# Patient Record
Sex: Female | Born: 1965 | Race: White | Hispanic: No | Marital: Single | Smoking: Current every day smoker
Health system: Southern US, Community
[De-identification: ages and names within clinical notes are randomized; demographics above are authoritative.]

## PROBLEM LIST (undated history)

## (undated) DIAGNOSIS — Z789 Other specified health status: Secondary | ICD-10-CM

## (undated) HISTORY — PX: OTHER SURGICAL HISTORY: SHX169

---

## 2017-02-05 ENCOUNTER — Encounter (HOSPITAL_COMMUNITY): Payer: Self-pay | Admitting: Pulmonary Disease

## 2017-02-05 ENCOUNTER — Inpatient Hospital Stay (HOSPITAL_COMMUNITY)
Admission: AD | Admit: 2017-02-05 | Discharge: 2017-03-05 | DRG: 871 | Disposition: E | Payer: Self-pay | Source: Other Acute Inpatient Hospital | Attending: Pulmonary Disease | Admitting: Pulmonary Disease

## 2017-02-05 ENCOUNTER — Inpatient Hospital Stay (HOSPITAL_COMMUNITY): Payer: Self-pay

## 2017-02-05 DIAGNOSIS — I959 Hypotension, unspecified: Secondary | ICD-10-CM | POA: Diagnosis present

## 2017-02-05 DIAGNOSIS — G9340 Encephalopathy, unspecified: Secondary | ICD-10-CM | POA: Diagnosis present

## 2017-02-05 DIAGNOSIS — J9601 Acute respiratory failure with hypoxia: Secondary | ICD-10-CM | POA: Diagnosis present

## 2017-02-05 DIAGNOSIS — J869 Pyothorax without fistula: Secondary | ICD-10-CM

## 2017-02-05 DIAGNOSIS — Z9689 Presence of other specified functional implants: Secondary | ICD-10-CM

## 2017-02-05 DIAGNOSIS — I468 Cardiac arrest due to other underlying condition: Secondary | ICD-10-CM | POA: Diagnosis not present

## 2017-02-05 DIAGNOSIS — E669 Obesity, unspecified: Secondary | ICD-10-CM | POA: Diagnosis present

## 2017-02-05 DIAGNOSIS — Z4682 Encounter for fitting and adjustment of non-vascular catheter: Secondary | ICD-10-CM

## 2017-02-05 DIAGNOSIS — A419 Sepsis, unspecified organism: Principal | ICD-10-CM | POA: Diagnosis present

## 2017-02-05 DIAGNOSIS — R739 Hyperglycemia, unspecified: Secondary | ICD-10-CM | POA: Diagnosis present

## 2017-02-05 DIAGNOSIS — J948 Other specified pleural conditions: Secondary | ICD-10-CM | POA: Diagnosis present

## 2017-02-05 DIAGNOSIS — K760 Fatty (change of) liver, not elsewhere classified: Secondary | ICD-10-CM | POA: Diagnosis present

## 2017-02-05 DIAGNOSIS — E872 Acidosis: Secondary | ICD-10-CM | POA: Diagnosis present

## 2017-02-05 DIAGNOSIS — F17210 Nicotine dependence, cigarettes, uncomplicated: Secondary | ICD-10-CM | POA: Diagnosis present

## 2017-02-05 DIAGNOSIS — R6521 Severe sepsis with septic shock: Secondary | ICD-10-CM | POA: Diagnosis present

## 2017-02-05 DIAGNOSIS — J189 Pneumonia, unspecified organism: Secondary | ICD-10-CM | POA: Diagnosis present

## 2017-02-05 HISTORY — DX: Other specified health status: Z78.9

## 2017-02-05 MED ORDER — LIDOCAINE HCL (PF) 1 % IJ SOLN
INTRAMUSCULAR | Status: AC
  Filled 2017-02-05: qty 5

## 2017-02-05 MED ORDER — ONDANSETRON HCL 4 MG/2ML IJ SOLN
4.0000 mg | Freq: Four times a day (QID) | INTRAMUSCULAR | Status: DC | PRN
Start: 2017-02-05 — End: 2017-02-05

## 2017-02-05 MED ORDER — FENTANYL BOLUS VIA INFUSION
25.0000 ug | INTRAVENOUS | Status: DC | PRN
  Filled 2017-02-05: qty 50

## 2017-02-05 MED ORDER — HEPARIN SODIUM (PORCINE) 5000 UNIT/ML IJ SOLN
5000.0000 [IU] | Freq: Three times a day (TID) | INTRAMUSCULAR | Status: DC
  Filled 2017-02-05: qty 1

## 2017-02-05 MED ORDER — NOREPINEPHRINE BITARTRATE 1 MG/ML IV SOLN
0.0000 ug/min | INTRAVENOUS | Status: DC
  Administered 2017-02-05: 20 ug/min via INTRAVENOUS
  Filled 2017-02-05 (×2): qty 4

## 2017-02-05 MED ORDER — LIDOCAINE HCL (PF) 1 % IJ SOLN
INTRAMUSCULAR | Status: AC
  Filled 2017-02-05: qty 30

## 2017-02-05 MED ORDER — INSULIN REGULAR BOLUS VIA INFUSION
0.0000 [IU] | Freq: Three times a day (TID) | INTRAVENOUS | Status: DC
  Filled 2017-02-05: qty 10

## 2017-02-05 MED ORDER — SODIUM BICARBONATE 8.4 % IV SOLN
INTRAVENOUS | Status: AC
  Filled 2017-02-05: qty 50

## 2017-02-05 MED ORDER — FENTANYL 2500MCG IN NS 250ML (10MCG/ML) PREMIX INFUSION
25.0000 ug/h | INTRAVENOUS | Status: DC
  Administered 2017-02-05: 150 ug/h via INTRAVENOUS

## 2017-02-05 MED ORDER — SODIUM CHLORIDE 0.9 % IV SOLN: INTRAVENOUS | Status: DC

## 2017-02-05 MED ORDER — SODIUM CHLORIDE 0.9% FLUSH: 10.0000 mL | Freq: Two times a day (BID) | INTRAVENOUS | Status: DC

## 2017-02-05 MED ORDER — VANCOMYCIN HCL IN DEXTROSE 1-5 GM/200ML-% IV SOLN
1000.0000 mg | Freq: Two times a day (BID) | INTRAVENOUS | Status: DC
  Filled 2017-02-05 (×2): qty 200

## 2017-02-05 MED ORDER — SODIUM CHLORIDE 0.9 % IV SOLN: 250.0000 mL | INTRAVENOUS | Status: DC | PRN

## 2017-02-05 MED ORDER — DEXTROSE 50 % IV SOLN: 25.0000 mL | INTRAVENOUS | Status: DC | PRN

## 2017-02-05 MED ORDER — CHLORHEXIDINE GLUCONATE CLOTH 2 % EX PADS: 6.0000 | MEDICATED_PAD | Freq: Every day | CUTANEOUS | Status: DC

## 2017-02-05 MED ORDER — HYDROCORTISONE NA SUCCINATE PF 100 MG IJ SOLR
50.0000 mg | Freq: Three times a day (TID) | INTRAMUSCULAR | Status: DC
  Filled 2017-02-05: qty 1

## 2017-02-05 MED ORDER — IPRATROPIUM-ALBUTEROL 0.5-2.5 (3) MG/3ML IN SOLN: 3.0000 mL | Freq: Four times a day (QID) | RESPIRATORY_TRACT | Status: DC

## 2017-02-05 MED ORDER — LEVOFLOXACIN IN D5W 750 MG/150ML IV SOLN: 750.0000 mg | INTRAVENOUS | Status: DC

## 2017-02-05 MED ORDER — VASOPRESSIN 20 UNIT/ML IV SOLN
0.0300 [IU]/min | INTRAVENOUS | Status: DC
  Filled 2017-02-05: qty 2

## 2017-02-05 MED ORDER — NOREPINEPHRINE BITARTRATE 1 MG/ML IV SOLN
0.0000 ug/min | INTRAVENOUS | Status: DC
  Administered 2017-02-05: 50 ug/min via INTRAVENOUS
  Filled 2017-02-05: qty 16

## 2017-02-05 MED ORDER — SODIUM CHLORIDE 0.9 % IV SOLN
1.0000 g | Freq: Three times a day (TID) | INTRAVENOUS | Status: DC
  Filled 2017-02-05 (×3): qty 1

## 2017-02-05 MED ORDER — PANTOPRAZOLE SODIUM 40 MG IV SOLR
40.0000 mg | Freq: Every day | INTRAVENOUS | Status: DC
Start: 2017-02-05 — End: 2017-02-05
  Filled 2017-02-05: qty 40

## 2017-02-05 MED ORDER — SODIUM CHLORIDE 0.9 % IV SOLN
INTRAVENOUS | Status: DC
  Filled 2017-02-05: qty 1

## 2017-02-05 MED ORDER — STERILE WATER FOR INJECTION IV SOLN
INTRAVENOUS | Status: DC
  Administered 2017-02-05: 11:00:00 via INTRAVENOUS
  Filled 2017-02-05 (×4): qty 850

## 2017-02-05 MED ORDER — MIDAZOLAM HCL 2 MG/2ML IJ SOLN: 1.0000 mg | INTRAMUSCULAR | Status: DC | PRN

## 2017-02-05 MED ORDER — SODIUM CHLORIDE 0.9% FLUSH: 10.0000 mL | INTRAVENOUS | Status: DC | PRN

## 2017-02-07 ENCOUNTER — Telehealth: Payer: Self-pay

## 2017-02-07 MED FILL — Medication: Qty: 1 | Status: AC

## 2017-02-07 NOTE — Telephone Encounter (Signed)
On 02/07/17 I received a death certificate from [facility] (faxed). The death certificate is for cremation. The patient is a patient of Designer, television/film setDoctor Nestor. The death certificate will be taken to Pulmonary Care this pm for signature.  On 02/10/17 I received the death certificate back from Doctor CokesburyNestor. I got the death certificate ready and called the funeral home to let them know I faxed the death certificate to the funeral home per the funeral home request.

## 2017-02-12 ENCOUNTER — Telehealth: Payer: Self-pay

## 2017-02-12 NOTE — Telephone Encounter (Signed)
On 02/12/2017 I received a [facility] The death certificate is for cremation. The patient is a patient of Designer, television/film setDoctor Nestor. The death certificate will be taken to Pulmonary Unit this pm for signature. On 02/13/2017 I received the death certificate back from Doctor PerryNestor. I got the death certificate ready and called the funeral home to let them know I mailed the death certificate to [employer] per the funeral home request.

## 2017-03-05 NOTE — Discharge Summary (Addendum)
DEATH NOTE: For a complete accounting of the patient's history and physical exam on presentation please refer to the H&P dictated by myself on 02/08/2017. The patient had no known history of any medical problems but also did not routinely seek medical care. She was a known chronic user of tobacco/cigarettes. As per the patient's significant other she has reportedly been treated for pneumonia & bronchitis multiple times since March. The patient has had continued cough that has been productive. The patient had a questionable mold exposure in her home and that time period as well. She had been treated with cough suppressant medications and prednisone as well as antibiotics including Levaquin without resolution of her symptoms. The patient ultimately presented to outside hospital with increased dyspnea and cough on 03/03/2017. In the emergency department the patient's respiratory status became more unstable and her level of alertness declined ultimately prompting endotracheal intubation by the emergency department physician. We were contacted to accept the patient in transfer to provide critical care treatment that was not available at their facility. Prior to transport the patient's clinical status continued to worsen and she became progressively hypotensive and developed a worsening metabolic acidosis. A femoral central venous catheter and arterial line were placed by the emergency department physician and she was subsequently started on a vasopressor infusion. Prior to transport I requested that a bicarbonate drip be initiated for patient's worsening acidosis. Unfortunately, this could not be achieved in a rapid fashion and the patient was transported to our facility without this being initiated. I gave instruction to give a bolus of 1 ampule of sodium bicarbonate en route to our facility to the transporting staff. Upon arrival patient was started on a bicarbonate drip and also given bolus bicarbonate. She was continued on  vasopressor infusion and orders were placed for broad-spectrum antibiotic therapy. Given the patient's right pleural effusion seen on CT imaging just prior to her transport I contacted thoracic surgery to perform a consultation and surgical chest tube placement. Patient had right basilar hydropneumothorax identified on stat portable chest x-ray after arrival to the ICU. Radiology also did comment on a possible left pneumothorax as well. Dr. Tyrone SageGerhardt came rapidly to the patient's bedside and placed a chest tube which resulted in expulsion of purulent fluid under pressure. Shortly thereafter the patient's clinical status continued to deteriorate and she went into a pulseless electrical activity arrest. CODE BLUE was initiated along with CPR and resuscitation efforts. Patient's mental drip was stopped and vasopressor drip was continued along with her bicarbonate drip. Despite bolus bicarbonate, epinephrine, and good quality CPR the patient never regained spontaneous circulation and her rhythm deteriorated to asystole on telemetry monitoring. The patient's mother and sister were outside of the room during our resuscitation efforts. At 11:50 AM on 02/17/2017 I pronounced the patient dead with asystolic rhythm on telemetry monitoring and no palpable or dopplerable pulse without arterial waveform. Hospital chaplain was at bedside and providing support for the patient's family. Patient's nurse contacted the medical examiner who felt this was not a candidate case.  DIAGNOSES AT DEATH: 1. Severe sepsis with septic shock 2. Acute hypoxic respiratory failure 3. Metabolic/lactic acidosis 4. Acute encephalopathy 5. Right empyema 6. Bilateral pneumothoraces 7. Hyperglycemia 8. Healthcare associated pneumonia  9. Tobacco use disorder  10. Obesity

## 2017-03-05 NOTE — H&P (Addendum)
[facility] Attending Consult  Physician [facility]ing Consult[facility]  [facility]  Date of Consult:  2016-09-14  Reason for Consult/Chief Complaint:  Acute Hypoxic Respiratory Failure  History of Presenting Illness:  51 y.o. with no known prior medical history. History obtained from my discussion with the patient's emergency department provider and significant other. No known previous medical or surgical history. Reportedly treated with Levaquin as an outpatient for pneumonia but has been treated since March per his report. Patient has been on antibiotics as well as prednisone and cough suppression medications. Questionable mold exposure in the home. No recent travel or sick contacts. Patient presented to hospital with worsening dyspnea and found to be in acute hypoxic respiratory failure. Ultimately required endotracheal intubation due to work of breathing and hypoxia with altered mentation in the emergency department. While in the emergency department the patient became progressively more unstable and hypotensive requiring arterial line placement and central venous catheter placement as well as initiation of vasopressor infusion with Levophed. Patient notably was also given vancomycin in the emergency department area  Review of Systems: Unable to obtain as the patient is intubated.   Allergies:  No known allergies.   No current facility-administered medications on file prior to encounter.    No current outpatient prescriptions on file prior to encounter.   Past Medical History:  No known medical problems.   Past Surgical History:  No known prior surgeries.  Family History:  Unable to obtain given patient's altered mentation and intubation.  Social History   Social History  . Marital status: Single    Spouse name: N/A  . Number of children: N/A  . Years of education: N/A   Social History Main Topics  . Smoking status: Current Every Day Smoker    Packs/day:  1.00    Types: Cigarettes  . Smokeless tobacco: None  . Alcohol use None  . Drug use: Unknown  . Sexual activity: Not Asked   Other Topics Concern  . None   Social History Narrative  . None    FiO2 (%):  [100 %] 100 % Set Rate:  [35 bmp] 35 bmp Vt Set:  [450 mL] 450 mL PEEP:  [8 cmH20] 8 cmH20 Plateau Pressure:  [40 cmH20] 40 cmH20  FiO2 (%):  [100 %] 100 % (07/04 0940)   General:  No distress. No family at bedside. Endotracheally intubated.  Integument:  Cool & dry. No rash on exposed skin. Some mottling of extremities. Lymphatics:  No appreciated cervical or supraclavicular lymphadenoapthy. HEENT:  Moist mucus membranes. No scleral icterus. Endotracheal tube in place. Cardiovascular:  Tachycardic with regular rhythm. No edema. No JVD appreciated.  Telemetry:  Sinus rhythm. Pulmonary:  Coarse breath sounds bilaterally on the left and decreased breath sounds on the right. Symmetric chest wall rise on ventilator. Abdomen: Soft. Hypoactive bowel sounds. Protuberant.  Musculoskeletal:  Normal bulk. No joint deformity or effusion appreciated. Neurological:  Pupils symmetric. Sedated.  Psychiatric:  Unable to assess given intubated status.   LINES/TUBES: OETT 7/4 >>> FEM CVL 7/4 >>> R RAD ART LINE 7/4 >>> Foley 7/4 >>> OGT 7/4 >>> PIV  IMAGING/STUDIES: PORT CXR 7/4:  Personally reviewed by me. Endotracheal tube somewhat high and approximately 5.5 severe above carina. Right-sided lung opacity. Questionable right loculated basilar hydropneumothorax.  MICROBIOLOGY: MRSA PCR 7/4 >>> Blood Cultures x2 7/4 >>> Tracheal Aspirate Culture 7/4 >>> Urine Streptococcal Antigen 7/4 >>> Urine Legionella Antigen 7/4 >>>  ANTIBIOTICS: Levaquin (pre-arrival in ED) >>> Vancomycin  7/4 >>> Merrem 7/4 >>>  SIGNIFICANT EVENTS: 07/04 - Admit after transfer from [facility] ED  ASSESSMENT/PLAN:  51 y.o. female presenting with acute hypoxic respiratory failure and septic shock.  Patient has worsening metabolic/lactic acidosis.  1. Severe sepsis with septic shock: Continuing vasopressor support. Repeating cultures and trending Procalcitonin algorithm. Continuing broad-spectrum antibiotics as below. Checking serum cortisol. 2. HCAP: Checking tracheal aspirate culture. Checking urine streptococcal and legionella antigen. Repeat blood cultures. Empiric treatment with Levaquin, vancomycin, and meropenem. 3. Probable right sided empyema: Consulting thoracic surgery for evaluation for surgical chest tube placement. 4. Severe metabolic/lactic acidosis: Trending lactic acid. Starting bicarbonate drip. Repeat ABG at 11 AM. 5. Acute encephalopathy:  Versed IV prn for sedation & Fentanyl for relief of pain.  6. Hyperglycemia:  No known history of diabetes mellitus. Checking hemoglobin A1c. Ordering insulin drip with Accu-Cheks every hour.  Prophylaxis:  SCDs, Heparin [place] q8hr, & Protonix IV daily.  Diet:  NPO. Holding on tube feedings given tenuous status. Code Status:  Presumed Full Code until family can be reached. Disposition:  Remains critically ill in the ICU.  Family Update:  Significant other updated at bedside by me.   I have personally spent a total of 36 minutes of critical care time today caring for the patient, updating her significant other at bedside, & reviewing the patient's electronic medical record.  Donna Christen Jamison Neighbor, M.D. [facility] Pager:  [phone] After 3pm or if no response, call [phone] 10:33 AM Feb 20, 2017

## 2017-03-05 NOTE — Progress Notes (Signed)
Patients family all left, given all post mortem care and took body down to morgue.  Danne HarborJohny,RN

## 2017-03-05 NOTE — Progress Notes (Signed)
Talked to the family and comforted them, received comfort cart.    Family is still waiting [facility]   Danne HarborJohny,RN

## 2017-03-05 NOTE — Consult Note (Signed)
[address]       [place] [address]             [phone]        Kathryn Dalton [facility] Medical Record #[id number] Date of Birth: 1965/11/01  Referring: Dr Jamison Neighbor  Primary Care: System, Provider Not In  Chief Complaint:   No chief complaint on file. septic ,   History of Present Illness:     Little history but been treated for pneumonia  For "several months" Could not breath and went to [place] last pm, intubated this am and transferred to [facility] in cardiovascular collapse now on pressors and in chosk   Current Activity/ Functional Status: Unknown    Zubrod Score: At the time of surgery this patient's most appropriate activity status/level should be described as: []     0    Normal activity, no symptoms []     1    Restricted in physical strenuous activity but ambulatory, able to do out light work []     2    Ambulatory and capable of self care, unable to do work activities, up and about                 more than 50%  Of the time                            []     3    Only limited self care, in bed greater than 50% of waking hours []     4    Completely disabled, no self care, confined to bed or chair [x]     5    Moribund  Past Medical History:  Diagnosis Date  . No known problems     Past Surgical History:  Procedure Laterality Date  . no known surgeries      History  Smoking Status  . Current Every Day Smoker  . Packs/day: 1.00  . Types: Cigarettes  Smokeless Tobacco  . Not on file    History  Alcohol use Not on file    Social History   Social History  . Marital status: Single    Spouse name: N/A  . Number of children: N/A  . Years of education: N/A   Occupational History  . Not on file.   Social History Main Topics  . Smoking status: Current Every Day Smoker    Packs/day: 1.00    Types: Cigarettes  . Smokeless tobacco: Not on file  . Alcohol use Not on file  . Drug use: Unknown  . Sexual activity: Not on file    Other Topics Concern  . Not on file   Social History Narrative  . No narrative on file    No Known Allergies  Current Facility-Administered Medications  Medication Dose Route Frequency Provider Last Rate Last Dose  . 0.9 %  sodium chloride infusion  250 mL Intravenous PRN Roslynn Amble, MD      . 0.9 %  sodium chloride infusion   Intravenous Continuous Roslynn Amble, MD      . Chlorhexidine Gluconate Cloth 2 % PADS 6 each  6 each Topical Daily Roslynn Amble, MD      . dextrose 50 % solution 25 mL  25 mL Intravenous PRN Roslynn Amble, MD      . fentaNYL (SUBLIMAZE) bolus via infusion 25-50 mcg  25-50 mcg Intravenous Q30 min PRN  Roslynn Amble, MD      . fentaNYL in NS (47mcg/ml) infusion-PREMIX  25-300 mcg/hr Intravenous Continuous Roslynn Amble, MD 15 mL/hr at 02-08-2017 0957 150 mcg/hr at February 08, 2017 0957  . heparin injection 5,000 Units  5,000 Units Subcutaneous Q8H Roslynn Amble, MD      . hydrocortisone sodium succinate (SOLU-CORTEF) 100 MG injection 50 mg  50 mg Intravenous Q8H Nestor, Donna Christen, MD      . insulin regular (NOVOLIN R,HUMULIN R) 100 Units in sodium chloride 0.9 % 100 mL (1 Units/mL) infusion   Intravenous Continuous Roslynn Amble, MD      . insulin regular bolus via infusion 0-10 Units  0-10 Units Intravenous TID WC Nestor, Donna Christen, MD      . ipratropium-albuterol (DUONEB) 0.5-2.5 (3) MG/3ML nebulizer solution 3 mL  3 mL Nebulization Q6H Nestor, Donna Christen, MD      . levofloxacin (LEVAQUIN) IVPB 750 mg  750 mg Intravenous Q24H Sallee Provencal, RPH      . lidocaine (PF) (XYLOCAINE) 1 % injection           . lidocaine (PF) (XYLOCAINE) 1 % injection           . meropenem (MERREM) 1 g in sodium chloride 0.9 % 100 mL IVPB  1 g Intravenous Q8H Turner, Eula Fried, RPH      . midazolam (VERSED) injection 1-4 mg  1-4 mg Intravenous Q1H PRN Roslynn Amble, MD      . norepinephrine (LEVOPHED) 16 mg in dextrose 5 % 250  mL (0.064 mg/mL) infusion  0-80 mcg/min Intravenous Titrated Hiatt, Kendra P, RPH      . ondansetron (ZOFRAN) injection 4 mg  4 mg Intravenous Q6H PRN Roslynn Amble, MD      . pantoprazole (PROTONIX) injection 40 mg  40 mg Intravenous QHS Roslynn Amble, MD      . sodium bicarbonate 1 mEq/mL injection           . sodium bicarbonate 1 mEq/mL injection           . sodium bicarbonate 1 mEq/mL injection           . sodium bicarbonate 150 mEq in sterile water 1,000 mL infusion   Intravenous Continuous Roslynn Amble, MD 125 mL/hr at 08-Feb-2017 1034    . sodium chloride flush (NS) 0.9 % injection 10-40 mL  10-40 mL Intracatheter Q12H Nestor, Donna Christen, MD      . sodium chloride flush (NS) 0.9 % injection 10-40 mL  10-40 mL Intracatheter PRN Roslynn Amble, MD      . vancomycin (VANCOCIN) IVPB 1000 mg/200 mL premix  1,000 mg Intravenous Q12H Sallee Provencal, RPH      . vasopressin (PITRESSIN) 40 Units in sodium chloride 0.9 % 250 mL (0.16 Units/mL) infusion  0.03 Units/min Intravenous Continuous Roslynn Amble, MD        No prescriptions prior to admission.    No family history on file.   Review of Systems:      Cardiac Review of Systems: Y or N  Chest Pain [    ]  Resting SOB [   ] Exertional SOB  [  ]  Orthopnea [  ]   Pedal Edema [   ]    Palpitations [  ] Syncope  [  ]   Presyncope [   ]  General Review of Systems: [Y] = yes [  ]=no  Constitional: recent weight change [  ]; anorexia [  ]; fatigue [  ]; nausea [  ]; night sweats [  ]; fever [  ]; or chills [  ]                                                               Dental: poor dentition[  ]; Last Dentist visit:   Eye : blurred vision [  ]; diplopia [   ]; vision changes [  ];  Amaurosis fugax[  ]; Resp: cough [  ];  wheezing[  ];  hemoptysis[  ]; shortness of breath[  ]; paroxysmal nocturnal dyspnea[  ]; dyspnea on exertion[  ]; or orthopnea[  ];  GI:  gallstones[  ], vomiting[  ];  dysphagia[  ]; melena[  ];   hematochezia [  ]; heartburn[  ];   Hx of  Colonoscopy[  ]; GU: kidney stones [  ]; hematuria[  ];   dysuria [  ];  nocturia[  ];  history of     obstruction [  ]; urinary frequency [  ]             Skin: rash, swelling[  ];, hair loss[  ];  peripheral edema[  ];  or itching[  ]; Musculosketetal: myalgias[  ];  joint swelling[  ];  joint erythema[  ];  joint pain[  ];  back pain[  ];  Heme/Lymph: bruising[  ];  bleeding[  ];  anemia[  ];  Neuro: TIA[  ];  headaches[  ];  stroke[  ];  vertigo[  ];  seizures[  ];   paresthesias[  ];  difficulty walking[  ];  Psych:depression[  ]; anxiety[  ];  Endocrine: diabetes[  ];  thyroid dysfunction[  ];  Immunizations: Flu [  ]; Pneumococcal[  ];  Other:  Physical Exam:  General appearance: sedated on vent BP 70/50  Head: Normocephalic, without obvious abnormality, atraumatic Neck: no adenopathy, no carotid bruit, no JVD, supple, symmetrical, trachea midline and thyroid not enlarged, symmetric, no tenderness/mass/nodules Lymph nodes: Cervical, supraclavicular, and axillary nodes normal. Resp: diminished breath sounds RLL, RML and RUL Cardio: heart rate 130  GI: soft, non-tender; bowel sounds normal; no masses,  no organomegaly Extremities: mottled to mid abdomen  Neurologic: sedated on vent   Diagnostic Studies & Laboratory data:     Recent Radiology Findings:   CLINICAL DATA: Shortness of breath and chest pain. Respiratory distress. Intubated.  EXAM: CT CHEST, ABDOMEN AND PELVIS WITHOUT CONTRAST  TECHNIQUE: Multidetector CT imaging of the chest, abdomen and pelvis was performed following the standard protocol without IV contrast.  COMPARISON: Chest x-ray 02/10/17 and 12/25/2016  FINDINGS: CT CHEST FINDINGS  Endotracheal tube has tip 4.4 cm above the carina. There is motion artifact.  Cardiovascular: Slight cardiomediastinal shift to the left. Heart is otherwise normal in size. Minimal calcified plaque over the  aortic arch.  Mediastinum/Nodes: There is mild pneumomediastinum. No definite mediastinal or hilar adenopathy. Remaining mediastinal structures are unremarkable.  Lungs/Pleura: Examination demonstrates paraseptal emphysematous disease with emphysematous blebs over the anterior right apex. There is a loculated pneumothorax over the lateral right base. There is a moderate size loculated pleural fluid collection over the posterior right thorax measuring approximately 10.6 x 13.1 x 17.3  cm in AP, transverse and craniocaudal dimensions. There is a subtle air-fluid level over the nondependent portion of this pleural collection. There is adjacent atelectatic change anterior to this pleural fluid collection. There is compressive atelectasis over the right middle lobe. There is mild opacification throughout the right lung which may be due to atelectasis, hemorrhage/edema or infection. Small loculated pleural fluid collection over the anteromedial right midlung. This process causes mild cardiomediastinal shift to the left. Mild heterogeneous opacification over the left base with patchy opacification over the left upper lung.  Musculoskeletal: Mild degenerate change of the spine.  CT ABDOMEN PELVIS FINDINGS  Hepatobiliary: Diffuse low-attenuation throughout the liver without focal mass. Minimal gallbladder distention without evidence of cholelithiasis.  Pancreas: Within normal.  Spleen: Within normal.  Adrenals/Urinary Tract: Within normal. Foley catheter within a decompressed bladder.  Stomach/Bowel: Nasogastric tube over the body of the stomach as the stomach is otherwise within normal. Small bowel is unremarkable. Appendix is normal. Colon is within normal.  Vascular/Lymphatic: Mild calcified plaque over the abdominal aorta and iliac arteries. No adenopathy.  Reproductive: Within normal.  Other: No free fluid or free peritoneal air.  Musculoskeletal: Minimal degenerate change  of the spine.  IMPRESSION: Large loculated right posterior pleural fluid collection measuring 10.6 x 13.1 x 17.3 cm. Smaller loculated pleural fluid collection over the anteromedial right midlung. Moderate associated opacification of the right lung with a loculated basilar right pneumothorax as well as mild pneumomediastinum. Slight cardiomediastinal shift to the left due to this right lung process. Findings may represent sequelae from infection with empyema and bronchopleural fistula. Patchy left lung opacification which may be due to infection.  Acute findings in the abdomen/pelvis.  Tubes and lines as described.  Hepatic steatosis.  Critical Value/emergent results were called by telephone at the time of interpretation on 02/20/2017 at 8:43 am to Dr. Littie DeedsGENTRY MD, who verbally acknowledged these results.   Electronically Signed By: Elberta Fortisaniel Boyle M.D. On: 2017-06-14 08:44   I have independently reviewed the above radiologic studies.  Recent Lab Findings: No results found for: WBC, HGB, HCT, PLT, GLUCOSE, CHOL, TRIG, HDL, LDLDIRECT, LDLCALC, ALT, AST, NA, K, CL, CREATININE, BUN, CO2, TSH, INR, GLUF, HGBA1C    Assessment / Plan:    Patient in septic shock with large   Collection of presumably pus in right chest - patient too ill for anything other then immediate placement of right chest tube for drainage , discussed with patient "husband" - boyfriend of 23 years  Will proceed urgently with chest tube placement      Delight OvensEdward B Meshell Abdulaziz MD      [address] [place] [id number] Office [phone]   Beeper [phone]  03/02/2017 11:21 AM

## 2017-03-05 NOTE — Procedures (Signed)
      [address]       [place] [id number]             [phone]      Chest Tube Insertion Procedure Note  Indications:  Clinically significant Empyema  Pre-operative Diagnosis: Empyema  Post-operative Diagnosis: Empyema  Procedure Details  Informed consent was obtained for the procedure, including sedation.  Risks of lung perforation, hemorrhage, arrhythmia, and adverse drug reaction were discussed.   After sterile skin prep, using standard technique, a 28 French tube was placed in the right lateral 6 rib space.  Findings: 500  ml of purulent fluid obtained, pus under pressure   Estimated Blood Loss:  Minimal         Specimens:  Sent purulent fluid              Complications:  None; patient tolerated the procedure well.         Disposition: ICU - intubated and critically ill.         Condition: unstable  Attending Attestation: I performed the procedure.

## 2017-03-05 NOTE — Progress Notes (Signed)
PT undergoing sterile procedure at this time- unable to collect and analyze ABG at this time. RN aware.

## 2017-03-05 NOTE — Progress Notes (Signed)
Contacted Medical Examiner Kathryn Dalton(Kathryn Dalton), and answered the questions. They reported Ms.Kathryn Dalton is not a candidate for ME.  Kathryn HarborJohny,RN

## 2017-03-05 NOTE — Progress Notes (Signed)
Responded to page from nurse b/c family was weeping in recently-admitted pt's rm. On way there, encountered pt's boyfriend (of 23 yrs) weeping in waiting rm. He believed he'd been told pt would not make it b/c of her pneumonia. Left him to go check when he could return to rm; nurse said w/in about 10 mins. Before that happened, pt's mother, sister, and niece arrived, so settled family in conference rm.   Just when told could bring family back, pt coded. Mom was present thruout, sister most of time, and boyfriend went to stay in conference rm w/ pt's niece (29, developmentally slow). Boyfriend returned after pt passed and mourned extensively. Mom (who has 13 children, 2 already deceased) was both crying and more stoic. She had understood Dr's telling her when it had been 13 mins.,and,  as we discussed what that could mean, she'd immediately said pt would not want to be on machines.   Checked w/ AC that family could remain in rm for a while. Many other family members are on way. Provided spiritual/emotional/grief support and prayer -- which they much appreciated. Family is [place], but pt had no particular church.  Told Mom of pt placement process -- and both her and nurse that nurse would give mom the number for family to call when they knew what funeral home or crematorium they'd choose. Chaplain available for f/u.    Jul 06, 2017 1200  Clinical Encounter Type  Visited With Family;Health care provider  Visit Type Initial;Follow-up;Psychological support;Spiritual support;Social support;Code;Death  Spiritual Encounters  Spiritual Needs Prayer;Emotional;Grief support  Stress Factors  Family Stress Factors Loss   Ephraim Hamburgerynthia A Denzil Bristol, [address]

## 2017-03-05 NOTE — Progress Notes (Addendum)
Lab called around 11:10 and asked for redraw of labs, because the tubes sent earlier got hemolyzed. Couldn't resent it again bcz patient started crashing.    [place] [facility], latest ABG was Ph-7.0,  CO2-34, O2-104, Bicarb-8.6 with a Lactic acid- 14.  Danne HarborJohny,RN

## 2017-03-05 NOTE — Progress Notes (Signed)
Pharmacy Antibiotic Note  Kathryn Dalton is a 51 y.o. female admitted on 02/13/2017 with pneumonia and sepsis.  Pharmacy has been consulted for Vancomycin, Meropenem, and Levaquin dosing. Per report she was recently treated for pneumonia. [facility] this morning and received doses of Vanc 2g at 0544 and Zosyn 3.375g at 0421. She had Levaquin ordered but not given per her MAR.  [facility] =1 and reported her weight as 90kg which would put her CrCl in the 60-80 ml/min range.  Plan: Levaquin 750mg  IV q24h - 1st dose now Meropenem 1g IV q8h - 1st dose now Vancomycin 1g IV q12h - 1st dose at 1800 (12h after her Vanc load at [place]) Given her clinical deterioration will review her SCr that is being obtained here to see if any change is noted that may impact drug dosing Follow available micro data     No data recorded.  No results for input(s): WBC, CREATININE, LATICACIDVEN, VANCOTROUGH, VANCOPEAK, VANCORANDOM, GENTTROUGH, GENTPEAK, GENTRANDOM, TOBRATROUGH, TOBRAPEAK, TOBRARND, AMIKACINPEAK, AMIKACINTROU, AMIKACIN in the last 168 hours.  CrCl cannot be calculated (No order found.).    Allergies not on file  Antimicrobials this admission: Vanc 7/4 >>  Meropenem 7/4 >>  Levaquin 7/4 >> Zosyn 3.375g x 1 [facility] 0421 Vancomycin [facility]g x 1 [facility] 7[facility] 0544  Dose adjustments this admission:   Microbiology results: 7/4 BCx:  7/4 Sputum:   Thank you for allowing pharmacy to be a part of this patient's care.  Estella HuskMichelle Mykaela Arena, Pharm.D., BCPS, AAHIVP Clinical Pharmacist Phone: [phone] or [phone] 02/18/2017, 10:34 AM

## 2017-03-05 NOTE — Code Documentation (Signed)
PCCM Attending Code Blue Note:  I was present in the ICU when patient went into PEA arrest without a palpable pulse on my exam. CODE BLUE was initiated. CPR was initiated. After repeated bolus epinephrine & bolus sodium HCO3 and approximately 20 minutes of resuscitative efforts, patient did not regain spontaneous circulation. Heart rhythm on the monitor showed asystole. Time of death 11:50 AM.  Patient's mother was present. Mother, sister, significant other, and additional family member updated regarding patient's passing. Hospital chaplain was present. Nurse to notify medical examiner to see if there was a desire for investigation but I feel none is necessary given her clinical picture.  Donna ChristenJennings E. Jamison NeighborNestor, M.D. [facility] Pager:  [phone] After 3pm or if no response, call [phone] 12:02 PM 2017-01-05

## 2017-03-05 DEATH — deceased

## 2018-04-08 IMAGING — DX DG CHEST 1V PORT
1 series · 1 of 1 positions shown · non-contrast
Comparison: CT and single-view of the chest 02/05/2017.

CLINICAL DATA: Intubated patient with a history of pneumonia for
several months.

EXAM:
PORTABLE CHEST 1 VIEW

[chest ap]
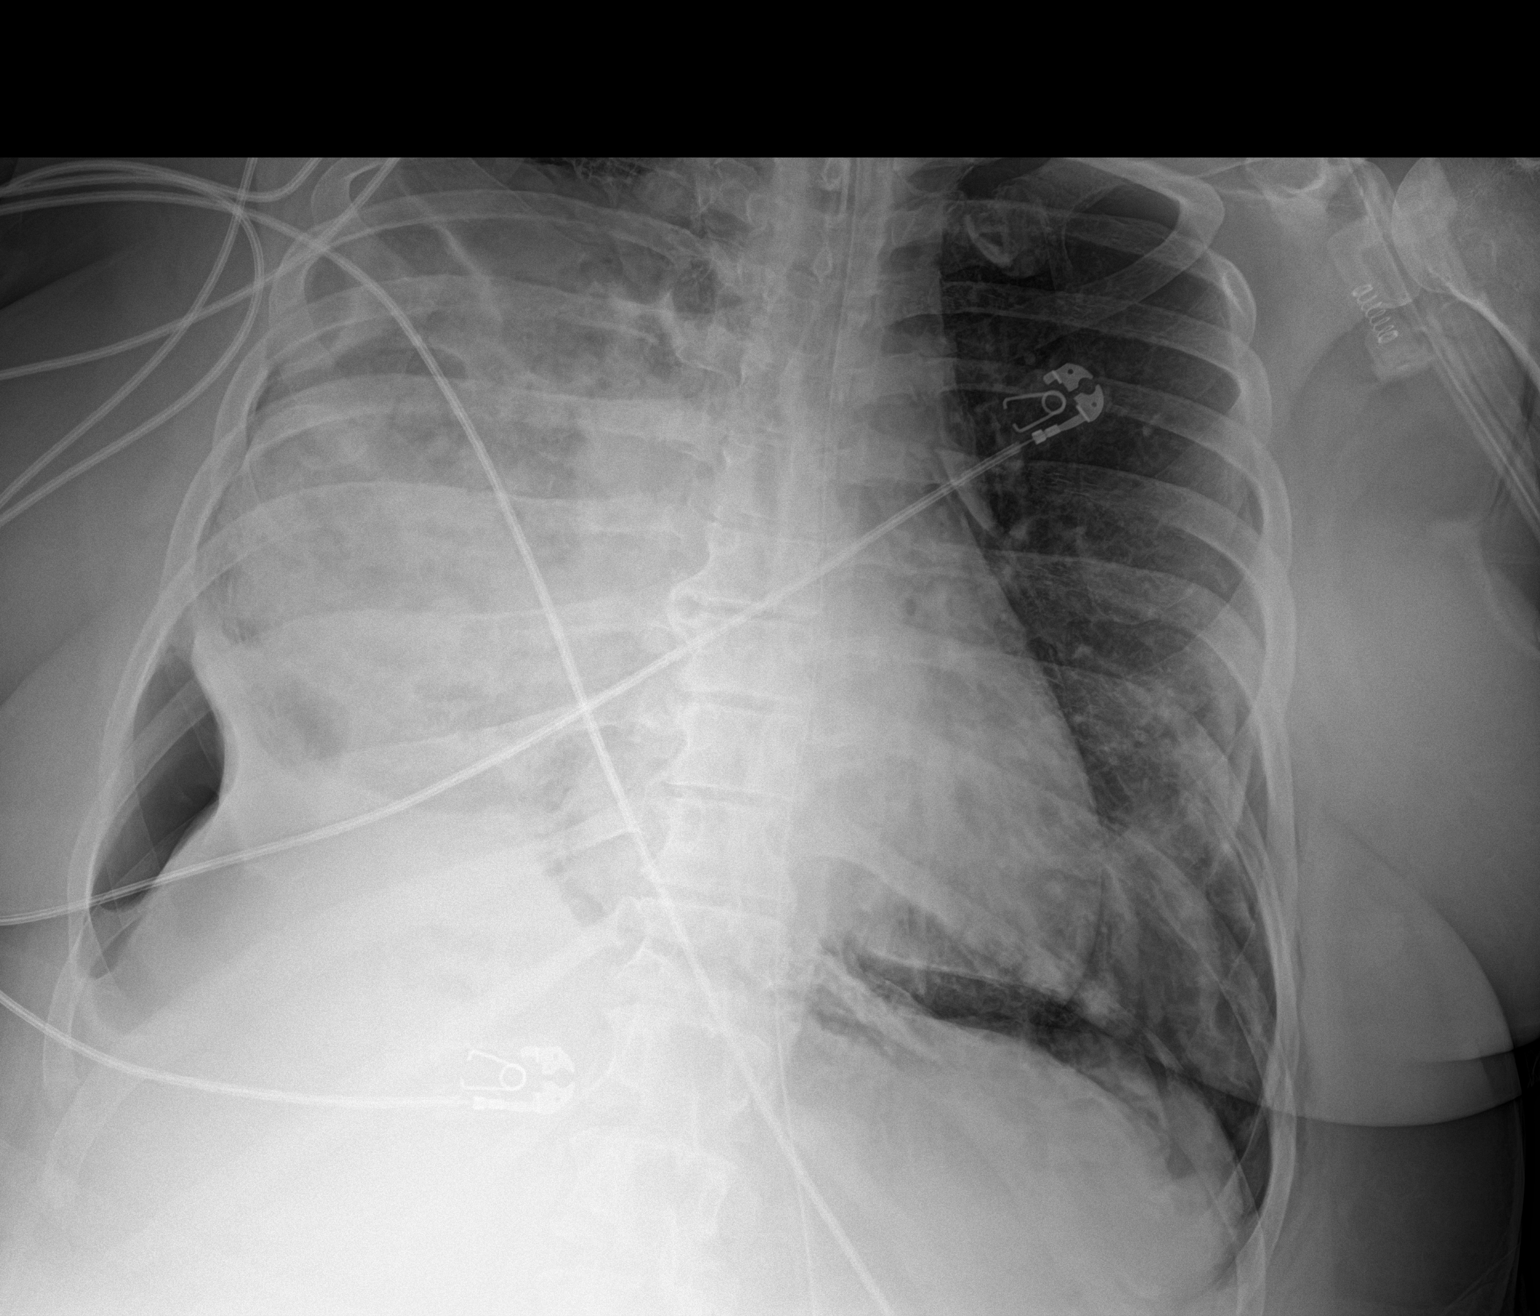

[1 of 1 positions shown; findings below may reference images not displayed]

FINDINGS: NG tube courses into the stomach and below the inferior margin of
film. Endotracheal tube tip is just below the clavicular heads, well
above the carina. Extensive opacity in the right chest consistent
with pleural effusion and airspace disease is identified as seen on
the prior examination. Loculated appearing right basilar and apical
pneumothoraces are again seen. There is patchy airspace disease in
the lingula and a left basilar pneumothorax both of which appear
worse than on the comparison exam.
IMPRESSION: No change in a loculated appearing right pleural fluid collection
and airspace disease. Loculated appearing apical and basilar
pneumothoraces also appear unchanged.

Worsened airspace disease in the lingula left lower lobe. Left
basilar pneumothorax appears increased compared to the prior exam.
